# Patient Record
Sex: Female | Born: 1970 | Hispanic: Yes | Marital: Married | Smoking: Never smoker
Health system: Southern US, Community
[De-identification: ages and names within clinical notes are randomized; demographics above are authoritative.]

## PROBLEM LIST (undated history)

## (undated) DIAGNOSIS — E119 Type 2 diabetes mellitus without complications: Secondary | ICD-10-CM

---

## 2017-06-20 ENCOUNTER — Emergency Department (HOSPITAL_COMMUNITY): Payer: No Typology Code available for payment source

## 2017-06-20 ENCOUNTER — Encounter (HOSPITAL_COMMUNITY): Payer: Self-pay | Admitting: *Deleted

## 2017-06-20 ENCOUNTER — Emergency Department (HOSPITAL_COMMUNITY)
Admission: EM | Admit: 2017-06-20 | Discharge: 2017-06-20 | Disposition: A | Discharge status: Home / Self Care | Payer: No Typology Code available for payment source | Attending: Emergency Medicine | Admitting: Emergency Medicine

## 2017-06-20 DIAGNOSIS — Y939 Activity, unspecified: Secondary | ICD-10-CM | POA: Diagnosis not present

## 2017-06-20 DIAGNOSIS — Y929 Unspecified place or not applicable: Secondary | ICD-10-CM | POA: Diagnosis not present

## 2017-06-20 DIAGNOSIS — M542 Cervicalgia: Secondary | ICD-10-CM | POA: Diagnosis present

## 2017-06-20 DIAGNOSIS — Y999 Unspecified external cause status: Secondary | ICD-10-CM | POA: Insufficient documentation

## 2017-06-20 DIAGNOSIS — M25511 Pain in right shoulder: Secondary | ICD-10-CM | POA: Insufficient documentation

## 2017-06-20 HISTORY — DX: Type 2 diabetes mellitus without complications: E11.9

## 2017-06-20 MED ORDER — DICLOFENAC SODIUM 75 MG PO TBEC: 75.0000 mg | DELAYED_RELEASE_TABLET | Freq: Two times a day (BID) | ORAL | 0 refills | Status: AC

## 2017-06-20 MED ORDER — IBUPROFEN 800 MG PO TABS
800.0000 mg | ORAL_TABLET | Freq: Once | ORAL | Status: AC
  Administered 2017-06-20: 800 mg via ORAL
  Filled 2017-06-20: qty 1

## 2017-06-20 MED ORDER — METHOCARBAMOL 500 MG PO TABS: 500.0000 mg | ORAL_TABLET | Freq: Two times a day (BID) | ORAL | 0 refills | Status: AC
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
## 2017-06-20 NOTE — Discharge Instructions (Signed)
Return if any problems.

## 2017-06-20 NOTE — ED Notes (Signed)
Pt in xray

## 2017-07-26 ENCOUNTER — Encounter (HOSPITAL_COMMUNITY): Payer: Self-pay | Admitting: Emergency Medicine

## 2017-07-26 ENCOUNTER — Ambulatory Visit (HOSPITAL_COMMUNITY)
Admission: EM | Admit: 2017-07-26 | Discharge: 2017-07-26 | Disposition: A | Discharge status: Home / Self Care | Payer: Worker's Compensation | Attending: Urgent Care | Admitting: Urgent Care

## 2017-07-26 DIAGNOSIS — Z026 Encounter for examination for insurance purposes: Secondary | ICD-10-CM

## 2017-07-26 DIAGNOSIS — W278XXA Contact with other nonpowered hand tool, initial encounter: Secondary | ICD-10-CM

## 2017-07-26 DIAGNOSIS — S61412A Laceration without foreign body of left hand, initial encounter: Secondary | ICD-10-CM | POA: Diagnosis not present

## 2017-07-26 DIAGNOSIS — Z23 Encounter for immunization: Secondary | ICD-10-CM | POA: Diagnosis not present

## 2017-07-26 DIAGNOSIS — M79642 Pain in left hand: Secondary | ICD-10-CM | POA: Diagnosis not present

## 2017-07-26 MED ORDER — TETANUS-DIPHTH-ACELL PERTUSSIS 5-2.5-18.5 LF-MCG/0.5 IM SUSP
INTRAMUSCULAR | Status: AC
  Filled 2017-07-26: qty 0.5

## 2017-07-26 MED ORDER — TETANUS-DIPHTH-ACELL PERTUSSIS 5-2.5-18.5 LF-MCG/0.5 IM SUSP
0.5000 mL | Freq: Once | INTRAMUSCULAR | Status: AC
  Administered 2017-07-26: 0.5 mL via INTRAMUSCULAR

## 2017-07-26 NOTE — ED Triage Notes (Signed)
Pt. Stated, I cut my left hand on a power tool at work around 4 today. 2 cuts. Hand is wrapped in gauze.
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
## 2017-07-26 NOTE — Discharge Instructions (Signed)
You may take 500mg Tylenol with ibuprofen 400-600mg every 6 hours for pain and inflammation. °
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
